# Patient Record
Sex: Female | Born: 2000 | Hispanic: No | Marital: Single | Smoking: Never smoker
Health system: Southern US, Community
[De-identification: ages and names within clinical notes are randomized; demographics above are authoritative.]

## PROBLEM LIST (undated history)

## (undated) DIAGNOSIS — Z789 Other specified health status: Secondary | ICD-10-CM

---
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
## 2021-02-11 ENCOUNTER — Emergency Department (HOSPITAL_COMMUNITY): Payer: Medicaid Other

## 2021-02-11 ENCOUNTER — Encounter (HOSPITAL_COMMUNITY): Payer: Self-pay

## 2021-02-11 ENCOUNTER — Emergency Department (HOSPITAL_COMMUNITY)
Admission: EM | Admit: 2021-02-11 | Discharge: 2021-02-12 | Disposition: A | Discharge status: Home / Self Care | Payer: Medicaid Other | Attending: Emergency Medicine | Admitting: Emergency Medicine

## 2021-02-11 ENCOUNTER — Other Ambulatory Visit: Payer: Self-pay

## 2021-02-11 DIAGNOSIS — Y92410 Unspecified street and highway as the place of occurrence of the external cause: Secondary | ICD-10-CM | POA: Diagnosis not present

## 2021-02-11 DIAGNOSIS — S3992XA Unspecified injury of lower back, initial encounter: Secondary | ICD-10-CM | POA: Diagnosis present

## 2021-02-11 DIAGNOSIS — T148XXA Other injury of unspecified body region, initial encounter: Secondary | ICD-10-CM | POA: Diagnosis not present

## 2021-02-11 DIAGNOSIS — S39012A Strain of muscle, fascia and tendon of lower back, initial encounter: Secondary | ICD-10-CM | POA: Diagnosis not present

## 2021-02-11 HISTORY — DX: Other specified health status: Z78.9

## 2021-02-11 LAB — I-STAT BETA HCG BLOOD, ED (MC, WL, AP ONLY): I-stat hCG, quantitative: <5 m[IU]/mL (ref <5)
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
## 2021-02-12 MED ORDER — IBUPROFEN 800 MG PO TABS: 800.0000 mg | ORAL_TABLET | Freq: Four times a day (QID) | ORAL | 0 refills | Status: AC | PRN

## 2021-02-12 MED ORDER — METHOCARBAMOL 500 MG PO TABS: 500.0000 mg | ORAL_TABLET | Freq: Three times a day (TID) | ORAL | 0 refills | Status: AC | PRN

## 2021-02-12 MED ORDER — IBUPROFEN 800 MG PO TABS
800.0000 mg | ORAL_TABLET | Freq: Once | ORAL | Status: AC
  Administered 2021-02-12: 800 mg via ORAL
  Filled 2021-02-12: qty 1

## 2021-02-12 MED ORDER — HYDROCODONE-ACETAMINOPHEN 5-325 MG PO TABS
1.0000 | ORAL_TABLET | Freq: Once | ORAL | Status: AC
  Administered 2021-02-12: 1 via ORAL
  Filled 2021-02-12: qty 1
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
## 2022-10-09 IMAGING — CR DG LUMBAR SPINE COMPLETE 4+V
5 series · 5 of 5 positions shown · non-contrast
Comparison: None.

CLINICAL DATA: Low back pain

EXAM:
LUMBAR SPINE - COMPLETE 4+ VIEW

[l-spine ap]
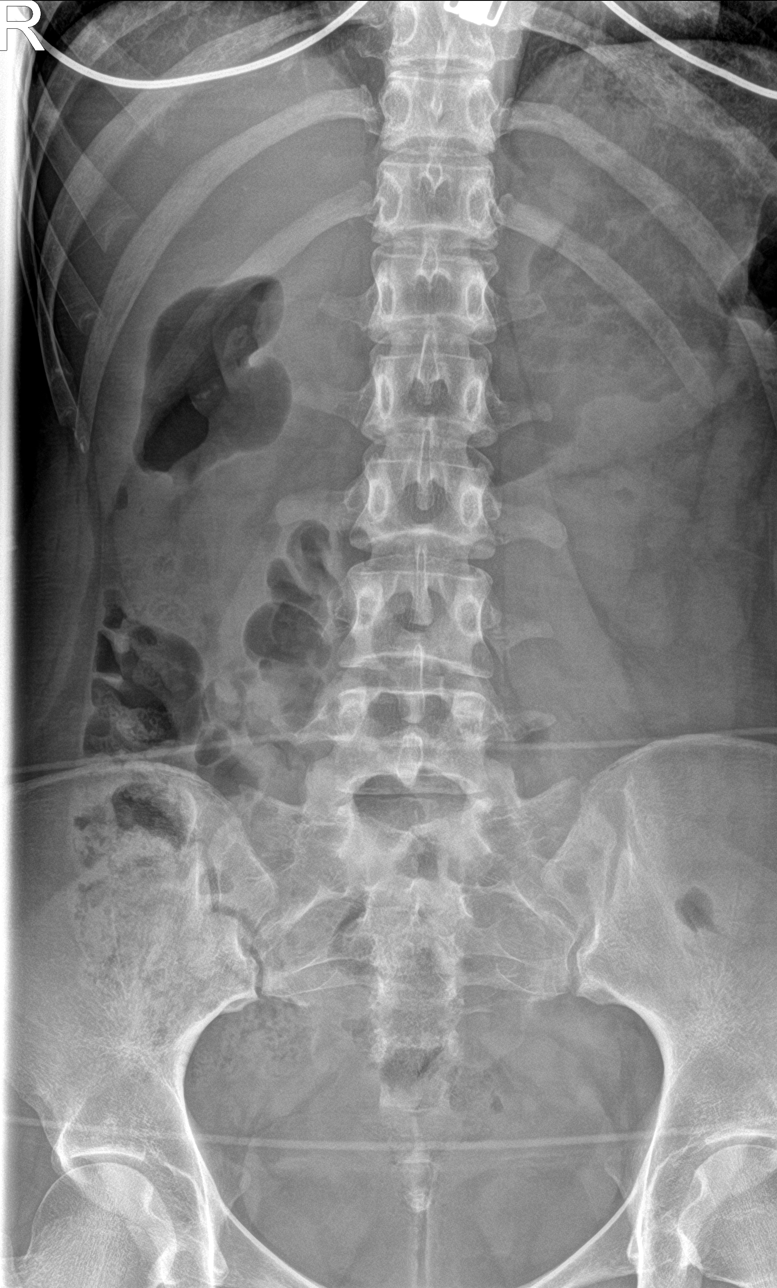

[l-spine obl (1 of 2)]
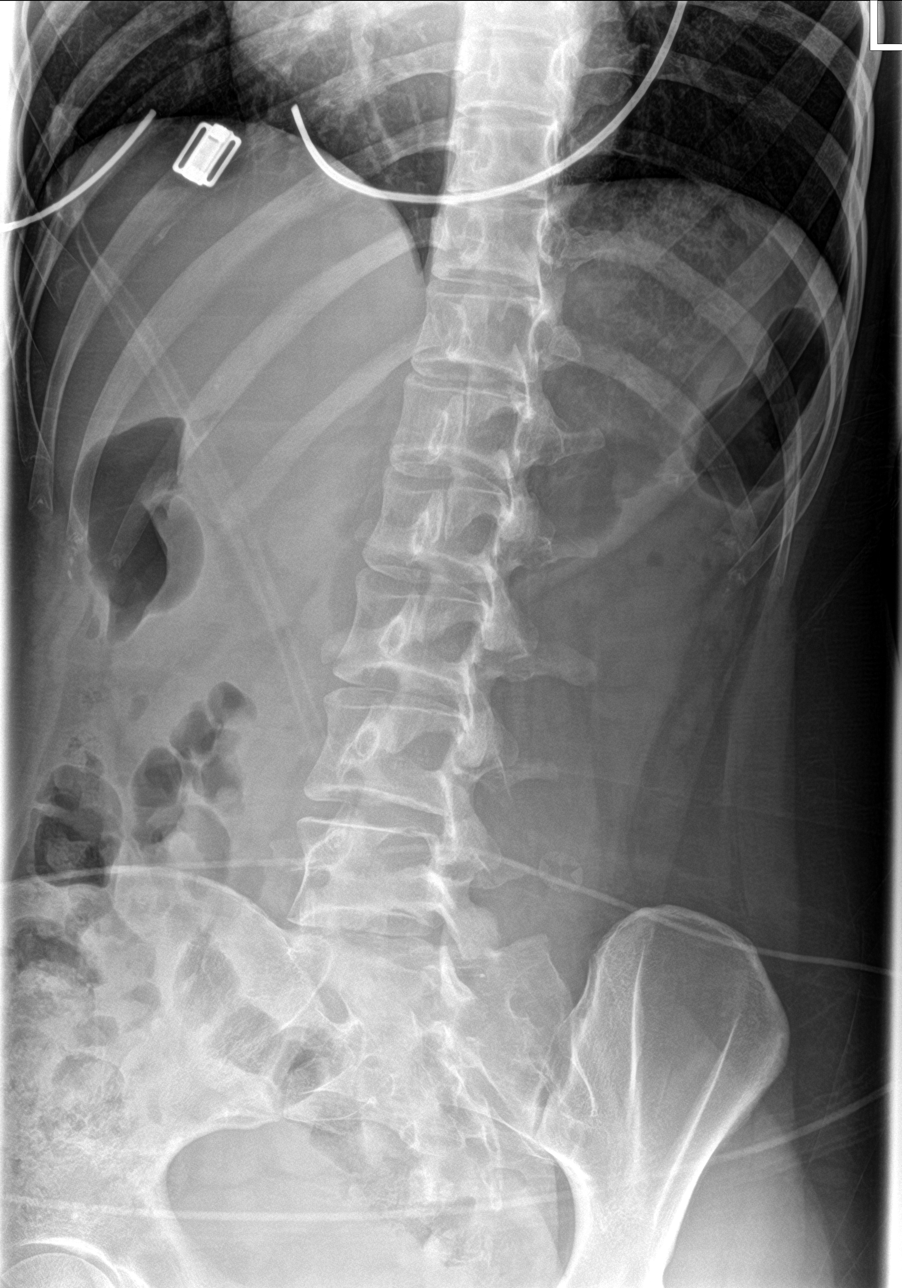

[l-spine obl (2 of 2)]
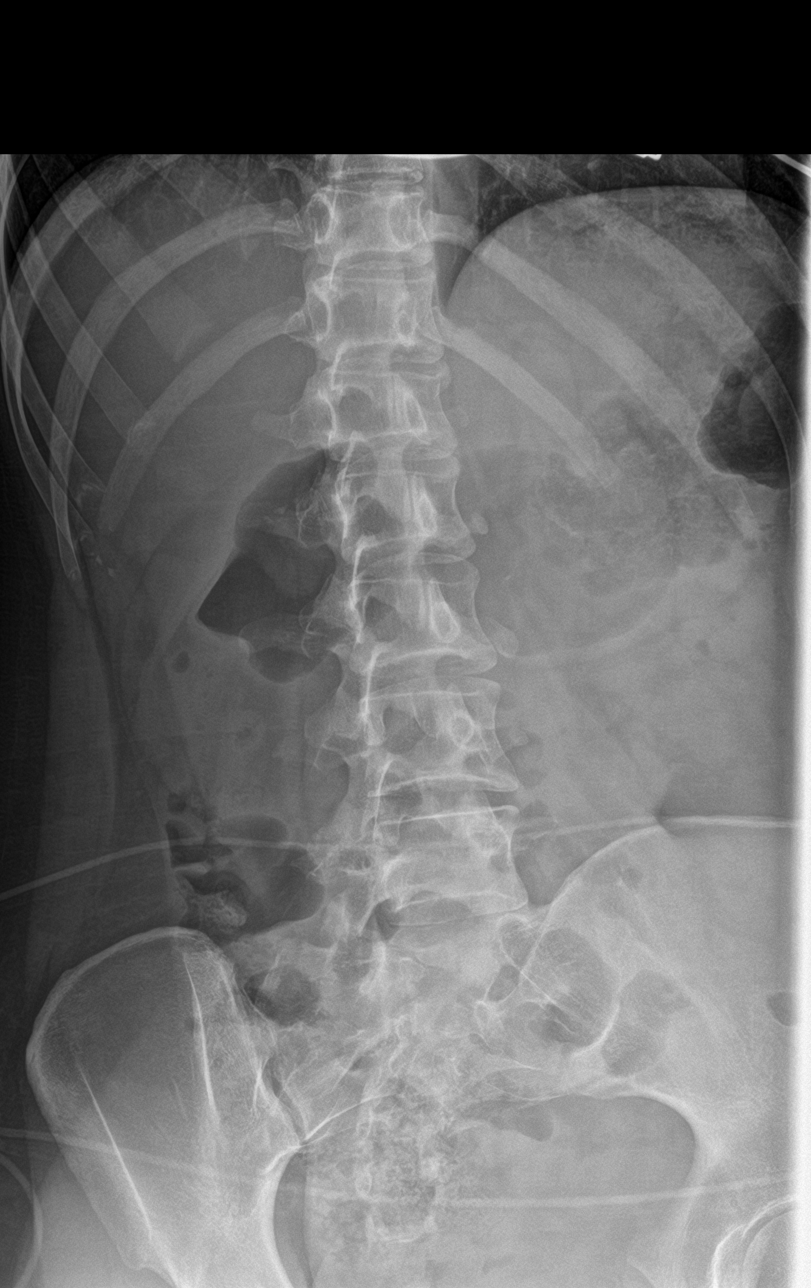

[l-spine lat]
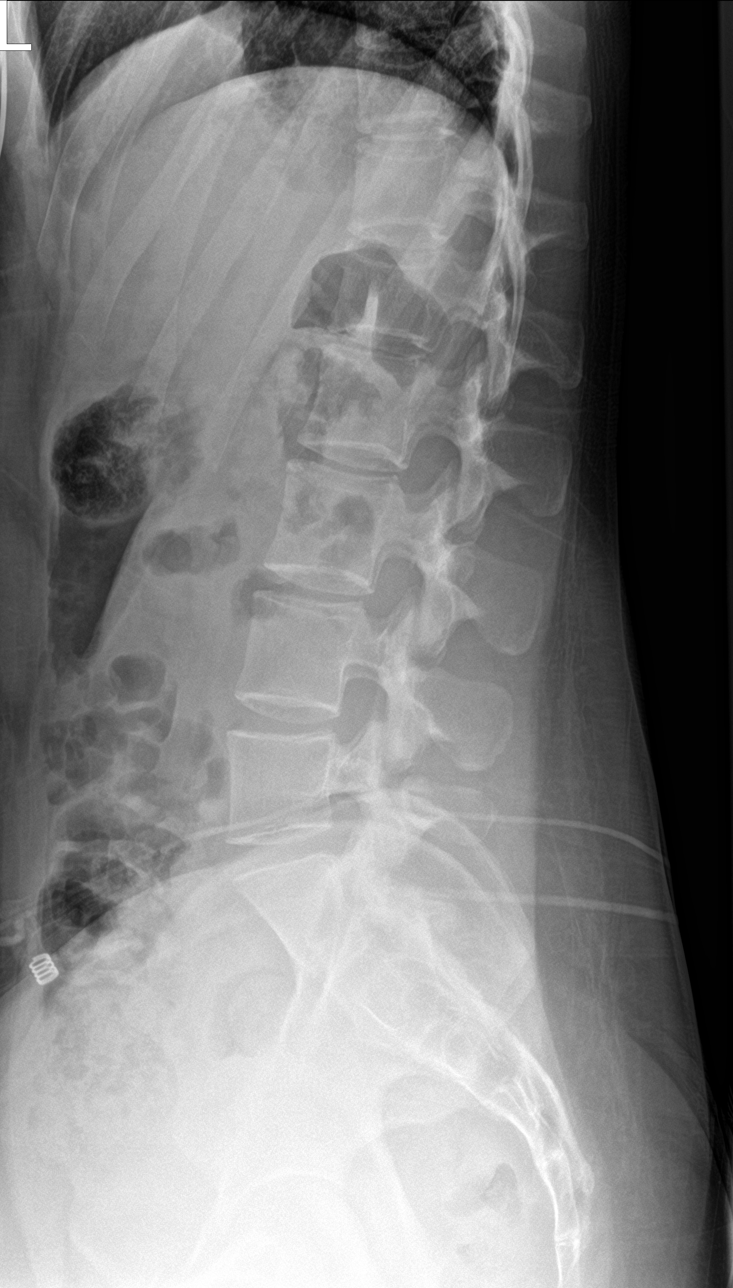

[l-spine spot]
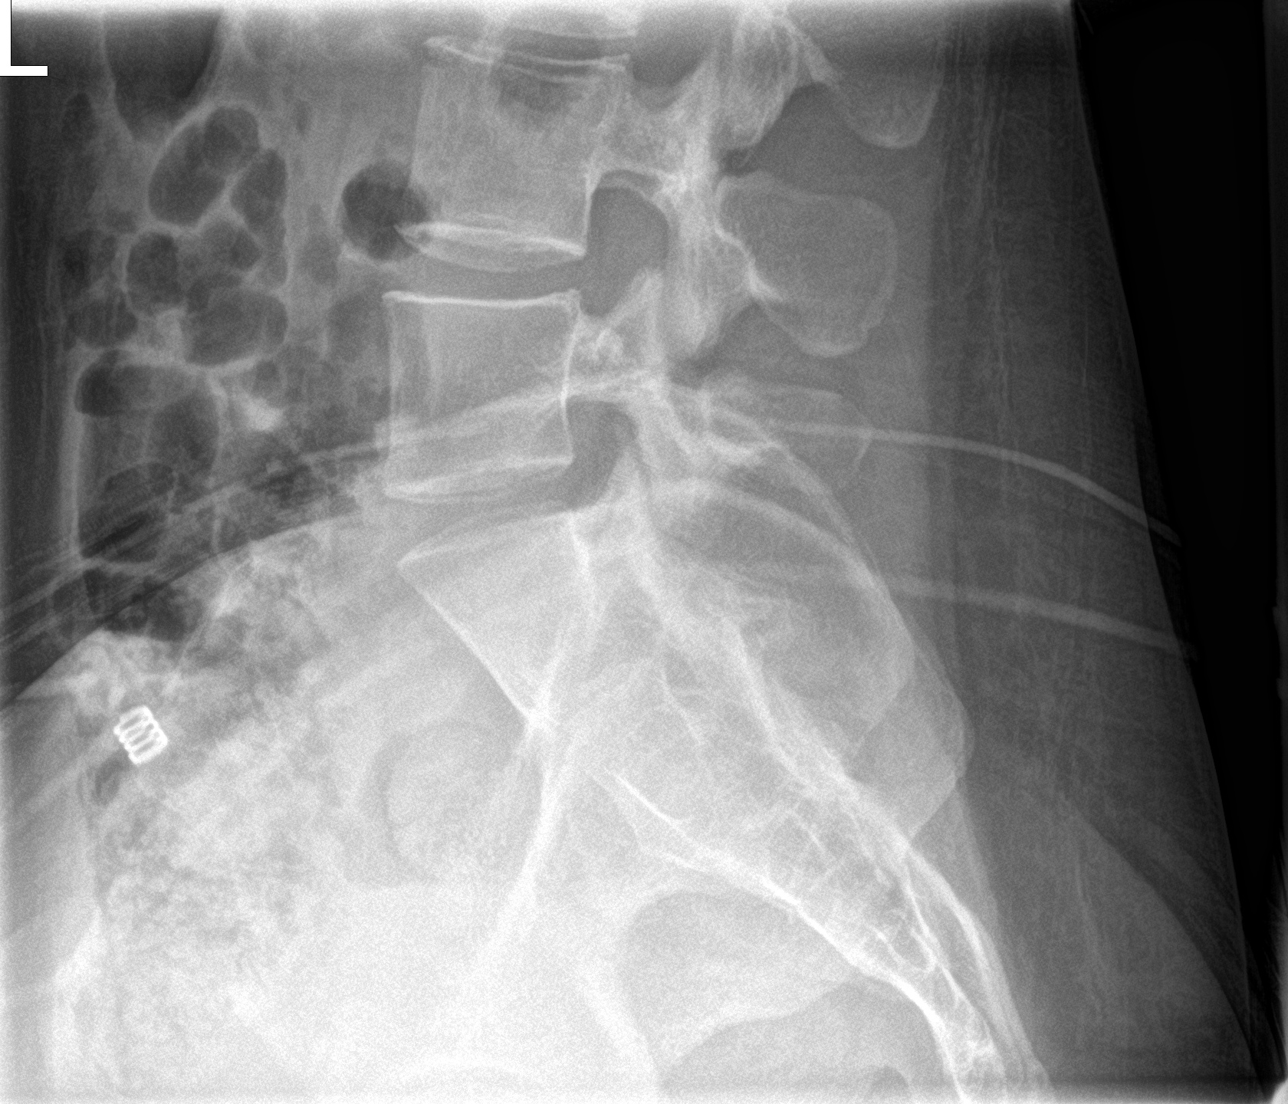

[5 of 5 positions shown; findings below may reference images not displayed]

FINDINGS: Five lumbar type vertebra. There is no acute fracture or subluxation
of the lumbar spine. The vertebral body heights and disc spaces are
maintained. The visualized posterior elements are intact. The soft
tissues are unremarkable
IMPRESSION: Negative.
